# Patient Record
Sex: Male | Born: 1995 | Race: Black or African American | Hispanic: No | Marital: Single | Smoking: Never smoker
Health system: Southern US, Community
[De-identification: ages and names within clinical notes are randomized; demographics above are authoritative.]

## PROBLEM LIST (undated history)

## (undated) DIAGNOSIS — R002 Palpitations: Secondary | ICD-10-CM

## (undated) DIAGNOSIS — R079 Chest pain, unspecified: Secondary | ICD-10-CM

## (undated) DIAGNOSIS — M940 Chondrocostal junction syndrome [Tietze]: Secondary | ICD-10-CM

## (undated) DIAGNOSIS — K219 Gastro-esophageal reflux disease without esophagitis: Secondary | ICD-10-CM

## (undated) HISTORY — DX: Palpitations: R00.2

## (undated) HISTORY — DX: Chondrocostal junction syndrome (tietze): M94.0

## (undated) HISTORY — DX: Chest pain, unspecified: R07.9

## (undated) HISTORY — DX: Gastro-esophageal reflux disease without esophagitis: K21.9

---
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
## 2018-03-14 ENCOUNTER — Encounter: Payer: Self-pay | Admitting: Pediatrics

## 2019-05-21 ENCOUNTER — Ambulatory Visit: Payer: Managed Care, Other (non HMO) | Attending: Internal Medicine

## 2019-05-21 ENCOUNTER — Other Ambulatory Visit: Payer: Self-pay

## 2019-05-21 DIAGNOSIS — Z20822 Contact with and (suspected) exposure to covid-19: Secondary | ICD-10-CM

## 2019-05-22 LAB — NOVEL CORONAVIRUS, NAA: SARS-CoV-2, NAA: NOT DETECTED

## 2020-01-23 ENCOUNTER — Telehealth: Payer: Self-pay | Admitting: Cardiology

## 2020-01-23 NOTE — Telephone Encounter (Signed)
I received a phone call from Mr. Wallace Cullens NP with Dr. Margo Aye regarding patient Chad Bond, [birth year]. I was asked to review ECG from today, also requested old tracing although difficult to interpret due to poor facsimile. Chad Bond explains that the patient has been evaluated for recurring, dull chest discomfort, treated for reflux, symptoms present today. Patient reported to be in no acute distress and stable. I personally reviewed his ECG which shows sinus rhythm with early repolarization, not STEMI pattern.  We discussed the case, I recommended to Chad Bond that they send a troponin I level. If this is normal, outpatient cardiology consultation would be appropriate presuming his symptoms do not further escalate.  Jonelle Sidle, M.D., F.A.C.C.

## 2020-02-24 ENCOUNTER — Ambulatory Visit: Payer: Self-pay | Admitting: Cardiology

## 2020-03-15 NOTE — Progress Notes (Signed)
Cardiology Office Note:    Date:  03/18/2020   ID:  Chad Bond, DOB [birth year], MRN [id number]  PCP:  Benita Stabile, MD  [facility] Cardiologist:  No primary care provider on file.  [facility] Electrophysiologist:  None   Referring MD: Benita Stabile, MD     History of Present Illness:    Chad Bond is a 24 y.o. male healthy male with no significant prior medical history who was referred by Dr. Margo Aye for evaluation of chest pain.  Patient states that he has been having intermittent chest pain since COVID started back in March 2020. Pain is located in the left side of the chest, sharp in nature, and usually worsened with rapid exhalation or certain positions while sleeping. No known triggers and usually lasts anywhere for 5-30 minutes and goes away without intervention. Usually occurs at rest. No exertional symptoms. Able to play 3 hours of basketball without issues. Has had a lot of anxiety with COVID and has been trying to cope with his anxiety. Thinks some of this pain may be related to anxiety issues as well.  Was seen by PCP who did ECG which was normal. He was recommended for PPI and possible muscle relaxant, but wanted to see a Cardiologist just in case.    Family history: No known history of CAD. Many relatives with hypertension.   Past Medical History:  Diagnosis Date  . Chest pain   . Costochondritis   . GERD (gastroesophageal reflux disease)   . Heart palpitations     No past surgical history on file.  Current Medications: No outpatient medications have been marked as taking for the 03/18/20 encounter (Office Visit) with Meriam Sprague, MD.     Allergies:   Patient has no known allergies.   Social History   Socioeconomic History  . Marital status: Single    Spouse name: Not on file  . Number of children: Not on file  . Years of education: Not on file  . Highest education level: Not on file  Occupational History  . Not on file    Tobacco Use  . Smoking status: Never Smoker  . Smokeless tobacco: Never Used  Substance and Sexual Activity  . Alcohol use: No  . Drug use: Not on file  . Sexual activity: Not on file  Other Topics Concern  . Not on file  Social History Narrative  . Not on file   Social Determinants of Health   Financial Resource Strain:   . Difficulty of Paying Living Expenses: Not on file  Food Insecurity:   . Worried About Programme researcher, broadcasting/film/video in the Last Year: Not on file  . Ran Out of Food in the Last Year: Not on file  Transportation Needs:   . Lack of Transportation (Medical): Not on file  . Lack of Transportation (Non-Medical): Not on file  Physical Activity:   . Days of Exercise per Week: Not on file  . Minutes of Exercise per Session: Not on file  Stress:   . Feeling of Stress : Not on file  Social Connections:   . Frequency of Communication with Friends and Family: Not on file  . Frequency of Social Gatherings with Friends and Family: Not on file  . Attends Religious Services: Not on file  . Active Member of Clubs or Organizations: Not on file  . Attends Banker Meetings: Not on file  . Marital Status: Not on file  Family History: No known family history of CAD Multiple relatives with HTN  ROS:   Please see the history of present illness.    Review of Systems  Constitutional: Negative for chills, fever and weight loss.  HENT: Negative for hearing loss.   Eyes: Negative for blurred vision.  Respiratory: Negative for cough.   Cardiovascular: Positive for chest pain. Negative for palpitations, orthopnea, claudication and leg swelling.  Gastrointestinal: Positive for heartburn. Negative for abdominal pain, nausea and vomiting.  Genitourinary: Negative for hematuria.  Musculoskeletal: Negative for myalgias.  Neurological: Negative for dizziness and loss of consciousness.  Endo/Heme/Allergies: Does not bruise/bleed easily.  Psychiatric/Behavioral: Negative  for depression.    EKGs/Labs/Other Studies Reviewed:    The following studies were reviewed today: No cardiac studies  EKG:  EKG is  ordered today.  The ekg ordered today demonstrates NSR with HR 62.  Recent Labs: No results found for requested labs within last 8760 hours.  Recent Lipid Panel No results found for: CHOL, TRIG, HDL, CHOLHDL, VLDL, LDLCALC, LDLDIRECT     Physical Exam:    VS:  BP 140/90   Pulse 62   Ht 6\' 2"  (1.88 m)   Wt 245 lb (111.1 kg)   SpO2 98%   BMI 31.46 kg/m     Wt Readings from Last 3 Encounters:  03/18/20 245 lb (111.1 kg)  10/04/16 268 lb (121.6 kg) (>99 %, Z= 2.67)*  08/29/14 287 lb 6.4 oz (130.4 kg) (>99 %, Z= 3.05)*   * Growth percentiles are based on CDC (Boys, 2-20 Years) data.     GEN:  Well nourished, well developed in no acute distress HEENT: Normal NECK: No JVD; No carotid bruits LYMPHATICS: No lymphadenopathy CARDIAC: RRR, 1/6 systolic flow murmur best heard at RUSB, no rubs, gallops RESPIRATORY:  Clear to auscultation without rales, wheezing or rhonchi  ABDOMEN: Soft, non-tender, non-distended MUSCULOSKELETAL:  No edema; No deformity  SKIN: Warm and dry NEUROLOGIC:  Alert and oriented x 3 PSYCHIATRIC:  Normal affect   ASSESSMENT:    1. Chest pain of uncertain etiology   2. Pre-diabetes    PLAN:    In order of problems listed above:  #Non-cardiac chest pain: Patient with episodes of sharp, left sided chest pain that usually occurs at rest and worsened with deep breathing or certain positions. No exertional symptoms. No personal or family history of CAD. Patient is very active without limitations. Do not suspect cardiac etiology of symptoms. -Agree with PCP to treat reflux and costochondritis due to low suspicion of cardiac etiology -No further cardiac work-up at this time   #Pre-diabetes: HgA1C 5.7%  -Counseled extensively about healthy diet and exercise. Patient is very interested in intermittent fasting and  mediterranean style diet  #Elevated blood pressure: BP 140/90s today although patient anxious about being in the Cardiology office. -Counseled about diet and lifestyle as below -Monitor BP at home with goal <120s/80s  Exercise recommendations: Goal of exercising for at least 30 minutes a day, at least 5 times per week.  Please exercise to a moderate exertion.  This means that while exercising it is difficult to speak in full sentences, however you are not so short of breath that you feel you must stop, and not so comfortable that you can carry on a full conversation.  Exertion level should be approximately a 5/10, if 10 is the most exertion you can perform.  Diet recommendations: Recommend a heart healthy diet such as the Mediterranean diet.  This diet consists of  plant based foods, healthy fats, lean meats, olive oil.  It suggests limiting the intake of simple carbohydrates such as white breads, pastries, and pastas.  It also limits the amount of red meat, wine, and dairy products such as cheese that one should consume on a daily basis.  Medication Adjustments/Labs and Tests Ordered: Current medicines are reviewed at length with the patient today.  Concerns regarding medicines are outlined above.  Orders Placed This Encounter  Procedures  . EKG 12-Lead   No orders of the defined types were placed in this encounter.   Patient Instructions  Medication Instructions:  Your physician recommends that you continue on your current medications as directed. Please refer to the Current Medication list given to you today.  *If you need a refill on your cardiac medications before your next appointment, please call your pharmacy*   Lab Work: If you have labs (blood work) drawn today and your tests are completely normal, you will receive your results only by: Marland Kitchen MyChart Message (if you have MyChart) OR . A paper copy in the mail If you have any lab test that is abnormal or we need to change your  treatment, we will call you to review the results.  Follow-Up: At [facility], you and your health needs are our priority.  As part of our continuing mission to provide you with exceptional heart care, we have created designated Provider Care Teams.  These Care Teams include your primary Cardiologist (physician) and Advanced Practice Providers (APPs -  Physician Assistants and Nurse Practitioners) who all work together to provide you with the care you need, when you need it.  We recommend signing up for the patient portal called "MyChart".  Sign up information is provided on this After Visit Summary.  MyChart is used to connect with patients for Virtual Visits (Telemedicine).  Patients are able to view lab/test results, encounter notes, upcoming appointments, etc.  Non-urgent messages can be sent to your provider as well.   To learn more about what you can do with MyChart, go to [email or web address].    Your next appointment:   As needed  The format for your next appointment:   In Person  Provider:   You may see Dr. Shari Prows or one of the following Advanced Practice Providers on your designated Care Team:    Tereso Newcomer, PA-C  [place], [place]    Other Instructions   Mediterranean Diet A Mediterranean diet refers to food and lifestyle choices that are based on the traditions of countries located on the [place]. This way of eating has been shown to help prevent certain conditions and improve outcomes for people who have chronic diseases, like kidney disease and heart disease. What are tips for following this plan? Lifestyle  Cook and eat meals together with your family, when possible.  Drink enough fluid to keep your urine clear or pale yellow.  Be physically active every day. This includes: ? Aerobic exercise like running or swimming. ? Leisure activities like gardening, walking, or housework.  Get 7-8 hours of sleep each night.  If recommended by your  health care provider, drink red wine in moderation. This means 1 glass a day for nonpregnant women and 2 glasses a day for men. A glass of wine equals 5 oz (150 mL). Reading food labels   Check the serving size of packaged foods. For foods such as rice and pasta, the serving size refers to the amount of cooked product, not dry.  Check the  total fat in packaged foods. Avoid foods that have saturated fat or trans fats.  Check the ingredients list for added sugars, such as corn syrup. Shopping  At the grocery store, buy most of your food from the areas near the walls of the store. This includes: ? Fresh fruits and vegetables (produce). ? Grains, beans, nuts, and seeds. Some of these may be available in unpackaged forms or large amounts (in bulk). ? Fresh seafood. ? Poultry and eggs. ? Low-fat dairy products.  Buy whole ingredients instead of prepackaged foods.  Buy fresh fruits and vegetables in-season from local farmers markets.  Buy frozen fruits and vegetables in resealable bags.  If you do not have access to quality fresh seafood, buy precooked frozen shrimp or canned fish, such as tuna, salmon, or sardines.  Buy small amounts of raw or cooked vegetables, salads, or olives from the deli or salad bar at your store.  Stock your pantry so you always have certain foods on hand, such as olive oil, canned tuna, canned tomatoes, rice, pasta, and beans. Cooking  Cook foods with extra-virgin olive oil instead of using butter or other vegetable oils.  Have meat as a side dish, and have vegetables or grains as your main dish. This means having meat in small portions or adding small amounts of meat to foods like pasta or stew.  Use beans or vegetables instead of meat in common dishes like chili or lasagna.  Experiment with different cooking methods. Try roasting or broiling vegetables instead of steaming or sauteing them.  Add frozen vegetables to soups, stews, pasta, or rice.  Add  nuts or seeds for added healthy fat at each meal. You can add these to yogurt, salads, or vegetable dishes.  Marinate fish or vegetables using olive oil, lemon juice, garlic, and fresh herbs. Meal planning   Plan to eat 1 vegetarian meal one day each week. Try to work up to 2 vegetarian meals, if possible.  Eat seafood 2 or more times a week.  Have healthy snacks readily available, such as: ? Vegetable sticks with hummus. ? [place] yogurt. ? Fruit and nut trail mix.  Eat balanced meals throughout the week. This includes: ? Fruit: 2-3 servings a day ? Vegetables: 4-5 servings a day ? Low-fat dairy: 2 servings a day ? Fish, poultry, or lean meat: 1 serving a day ? Beans and legumes: 2 or more servings a week ? Nuts and seeds: 1-2 servings a day ? Whole grains: 6-8 servings a day ? Extra-virgin olive oil: 3-4 servings a day  Limit red meat and sweets to only a few servings a month What are my food choices?  Mediterranean diet ? Recommended  Grains: Whole-grain pasta. Brown rice. Bulgar wheat. Polenta. Couscous. Whole-wheat bread. Orpah Cobb.  Vegetables: Artichokes. Beets. Broccoli. Cabbage. Carrots. Eggplant. Green beans. Chard. Kale. Spinach. Onions. Leeks. Peas. Squash. Tomatoes. Peppers. Radishes.  Fruits: Apples. Apricots. Avocado. Berries. Bananas. Cherries. Dates. Figs. Grapes. Lemons. Melon. Oranges. Peaches. Plums. Pomegranate.  Meats and other protein foods: Beans. Almonds. Sunflower seeds. Pine nuts. Peanuts. Cod. Salmon. Scallops. Shrimp. Tuna. Tilapia. Clams. Oysters. Eggs.  Dairy: Low-fat milk. Cheese. Greek yogurt.  Beverages: Water. Red wine. Herbal tea.  Fats and oils: Extra virgin olive oil. Avocado oil. Grape seed oil.  Sweets and desserts: [place] yogurt with honey. Baked apples. Poached pears. Trail mix.  Seasoning and other foods: Basil. Cilantro. Coriander. Cumin. Mint. Parsley. Sage. Rosemary. Tarragon. Garlic. Oregano. Thyme. Pepper. Balsalmic  vinegar. Tahini. Hummus. Tomato sauce.  Olives. Mushrooms. ? Limit these  Grains: Prepackaged pasta or rice dishes. Prepackaged cereal with added sugar.  Vegetables: Deep fried potatoes (french fries).  Fruits: Fruit canned in syrup.  Meats and other protein foods: Beef. Pork. Lamb. Poultry with skin. Hot dogs. Tomasa Blase.  Dairy: Ice cream. Sour cream. Whole milk.  Beverages: Juice. Sugar-sweetened soft drinks. Beer. Liquor and spirits.  Fats and oils: Butter. Canola oil. Vegetable oil. Beef fat (tallow). Lard.  Sweets and desserts: Cookies. Cakes. Pies. Candy.  Seasoning and other foods: Mayonnaise. Premade sauces and marinades. The items listed may not be a complete list. Talk with your dietitian about what dietary choices are right for you. Summary  The Mediterranean diet includes both food and lifestyle choices.  Eat a variety of fresh fruits and vegetables, beans, nuts, seeds, and whole grains.  Limit the amount of red meat and sweets that you eat.  Talk with your health care provider about whether it is safe for you to drink red wine in moderation. This means 1 glass a day for nonpregnant women and 2 glasses a day for men. A glass of wine equals 5 oz (150 mL). This information is not intended to replace advice given to you by your health care provider. Make sure you discuss any questions you have with your health care provider. Document Revised: 12/31/2015 Document Reviewed: 12/24/2015 Elsevier Patient Education  2020 [employer].     Signed, Meriam Sprague, MD  03/18/2020 10:11 AM    [facility]

## 2020-03-18 ENCOUNTER — Ambulatory Visit: Payer: Managed Care, Other (non HMO) | Admitting: Cardiology

## 2020-03-18 ENCOUNTER — Encounter: Payer: Self-pay | Admitting: Cardiology

## 2020-03-18 ENCOUNTER — Other Ambulatory Visit: Payer: Self-pay

## 2020-03-18 VITALS — BP 140/90 | HR 62 | Ht 74.0 in | Wt 245.0 lb

## 2020-03-18 DIAGNOSIS — R7303 Prediabetes: Secondary | ICD-10-CM

## 2020-03-18 DIAGNOSIS — R079 Chest pain, unspecified: Secondary | ICD-10-CM | POA: Diagnosis not present

## 2020-03-18 NOTE — Patient Instructions (Addendum)
Medication Instructions:  Your physician recommends that you continue on your current medications as directed. Please refer to the Current Medication list given to you today.  *If you need a refill on your cardiac medications before your next appointment, please call your pharmacy*   Lab Work: If you have labs (blood work) drawn today and your tests are completely normal, you will receive your results only by: Marland Kitchen MyChart Message (if you have MyChart) OR . A paper copy in the mail If you have any lab test that is abnormal or we need to change your treatment, we will call you to review the results.  Follow-Up: At [facility], you and your health needs are our priority.  As part of our continuing mission to provide you with exceptional heart care, we have created designated Provider Care Teams.  These Care Teams include your primary Cardiologist (physician) and Advanced Practice Providers (APPs -  Physician Assistants and Nurse Practitioners) who all work together to provide you with the care you need, when you need it.  We recommend signing up for the patient portal called "MyChart".  Sign up information is provided on this After Visit Summary.  MyChart is used to connect with patients for Virtual Visits (Telemedicine).  Patients are able to view lab/test results, encounter notes, upcoming appointments, etc.  Non-urgent messages can be sent to your provider as well.   To learn more about what you can do with MyChart, go to [email or web address].    Your next appointment:   As needed  The format for your next appointment:   In Person  Provider:   You may see Dr. Shari Prows or one of the following Advanced Practice Providers on your designated Care Team:    Tereso Newcomer, PA-C  [place], [place]    Other Instructions   Mediterranean Diet A Mediterranean diet refers to food and lifestyle choices that are based on the traditions of countries located on the [place]. This way  of eating has been shown to help prevent certain conditions and improve outcomes for people who have chronic diseases, like kidney disease and heart disease. What are tips for following this plan? Lifestyle  Cook and eat meals together with your family, when possible.  Drink enough fluid to keep your urine clear or pale yellow.  Be physically active every day. This includes: ? Aerobic exercise like running or swimming. ? Leisure activities like gardening, walking, or housework.  Get 7-8 hours of sleep each night.  If recommended by your health care provider, drink red wine in moderation. This means 1 glass a day for nonpregnant women and 2 glasses a day for men. A glass of wine equals 5 oz (150 mL). Reading food labels   Check the serving size of packaged foods. For foods such as rice and pasta, the serving size refers to the amount of cooked product, not dry.  Check the total fat in packaged foods. Avoid foods that have saturated fat or trans fats.  Check the ingredients list for added sugars, such as corn syrup. Shopping  At the grocery store, buy most of your food from the areas near the walls of the store. This includes: ? Fresh fruits and vegetables (produce). ? Grains, beans, nuts, and seeds. Some of these may be available in unpackaged forms or large amounts (in bulk). ? Fresh seafood. ? Poultry and eggs. ? Low-fat dairy products.  Buy whole ingredients instead of prepackaged foods.  Buy fresh fruits and vegetables in-season from local  farmers markets.  Buy frozen fruits and vegetables in resealable bags.  If you do not have access to quality fresh seafood, buy precooked frozen shrimp or canned fish, such as tuna, salmon, or sardines.  Buy small amounts of raw or cooked vegetables, salads, or olives from the deli or salad bar at your store.  Stock your pantry so you always have certain foods on hand, such as olive oil, canned tuna, canned tomatoes, rice, pasta, and  beans. Cooking  Cook foods with extra-virgin olive oil instead of using butter or other vegetable oils.  Have meat as a side dish, and have vegetables or grains as your main dish. This means having meat in small portions or adding small amounts of meat to foods like pasta or stew.  Use beans or vegetables instead of meat in common dishes like chili or lasagna.  Experiment with different cooking methods. Try roasting or broiling vegetables instead of steaming or sauteing them.  Add frozen vegetables to soups, stews, pasta, or rice.  Add nuts or seeds for added healthy fat at each meal. You can add these to yogurt, salads, or vegetable dishes.  Marinate fish or vegetables using olive oil, lemon juice, garlic, and fresh herbs. Meal planning   Plan to eat 1 vegetarian meal one day each week. Try to work up to 2 vegetarian meals, if possible.  Eat seafood 2 or more times a week.  Have healthy snacks readily available, such as: ? Vegetable sticks with hummus. ? [place] yogurt. ? Fruit and nut trail mix.  Eat balanced meals throughout the week. This includes: ? Fruit: 2-3 servings a day ? Vegetables: 4-5 servings a day ? Low-fat dairy: 2 servings a day ? Fish, poultry, or lean meat: 1 serving a day ? Beans and legumes: 2 or more servings a week ? Nuts and seeds: 1-2 servings a day ? Whole grains: 6-8 servings a day ? Extra-virgin olive oil: 3-4 servings a day  Limit red meat and sweets to only a few servings a month What are my food choices?  Mediterranean diet ? Recommended  Grains: Whole-grain pasta. Brown rice. Bulgar wheat. Polenta. Couscous. Whole-wheat bread. Orpah Cobb.  Vegetables: Artichokes. Beets. Broccoli. Cabbage. Carrots. Eggplant. Green beans. Chard. Kale. Spinach. Onions. Leeks. Peas. Squash. Tomatoes. Peppers. Radishes.  Fruits: Apples. Apricots. Avocado. Berries. Bananas. Cherries. Dates. Figs. Grapes. Lemons. Melon. Oranges. Peaches. Plums.  Pomegranate.  Meats and other protein foods: Beans. Almonds. Sunflower seeds. Pine nuts. Peanuts. Cod. Salmon. Scallops. Shrimp. Tuna. Tilapia. Clams. Oysters. Eggs.  Dairy: Low-fat milk. Cheese. Greek yogurt.  Beverages: Water. Red wine. Herbal tea.  Fats and oils: Extra virgin olive oil. Avocado oil. Grape seed oil.  Sweets and desserts: [place] yogurt with honey. Baked apples. Poached pears. Trail mix.  Seasoning and other foods: Basil. Cilantro. Coriander. Cumin. Mint. Parsley. Sage. Rosemary. Tarragon. Garlic. Oregano. Thyme. Pepper. Balsalmic vinegar. Tahini. Hummus. Tomato sauce. Olives. Mushrooms. ? Limit these  Grains: Prepackaged pasta or rice dishes. Prepackaged cereal with added sugar.  Vegetables: Deep fried potatoes (french fries).  Fruits: Fruit canned in syrup.  Meats and other protein foods: Beef. Pork. Lamb. Poultry with skin. Hot dogs. Tomasa Blase.  Dairy: Ice cream. Sour cream. Whole milk.  Beverages: Juice. Sugar-sweetened soft drinks. Beer. Liquor and spirits.  Fats and oils: Butter. Canola oil. Vegetable oil. Beef fat (tallow). Lard.  Sweets and desserts: Cookies. Cakes. Pies. Candy.  Seasoning and other foods: Mayonnaise. Premade sauces and marinades. The items listed may not be a complete  list. Talk with your dietitian about what dietary choices are right for you. Summary  The Mediterranean diet includes both food and lifestyle choices.  Eat a variety of fresh fruits and vegetables, beans, nuts, seeds, and whole grains.  Limit the amount of red meat and sweets that you eat.  Talk with your health care provider about whether it is safe for you to drink red wine in moderation. This means 1 glass a day for nonpregnant women and 2 glasses a day for men. A glass of wine equals 5 oz (150 mL). This information is not intended to replace advice given to you by your health care provider. Make sure you discuss any questions you have with your health care  provider. Document Revised: 12/31/2015 Document Reviewed: 12/24/2015 Elsevier Patient Education  2020 [employer].
